# Patient Record
Sex: Male | Born: 1980 | Race: Black or African American | Hispanic: No | Marital: Single | Smoking: Never smoker
Health system: Southern US, Community
[De-identification: ages and names within clinical notes are randomized; demographics above are authoritative.]

---

## 2013-03-30 ENCOUNTER — Emergency Department (HOSPITAL_BASED_OUTPATIENT_CLINIC_OR_DEPARTMENT_OTHER): Payer: No Typology Code available for payment source

## 2013-03-30 ENCOUNTER — Emergency Department (HOSPITAL_BASED_OUTPATIENT_CLINIC_OR_DEPARTMENT_OTHER)
Admission: EM | Admit: 2013-03-30 | Discharge: 2013-03-31 | Disposition: A | Discharge status: Home / Self Care | Payer: No Typology Code available for payment source | Attending: Emergency Medicine | Admitting: Emergency Medicine

## 2013-03-30 ENCOUNTER — Encounter (HOSPITAL_BASED_OUTPATIENT_CLINIC_OR_DEPARTMENT_OTHER): Payer: Self-pay | Admitting: Emergency Medicine

## 2013-03-30 DIAGNOSIS — S60219A Contusion of unspecified wrist, initial encounter: Secondary | ICD-10-CM | POA: Insufficient documentation

## 2013-03-30 DIAGNOSIS — S161XXA Strain of muscle, fascia and tendon at neck level, initial encounter: Secondary | ICD-10-CM

## 2013-03-30 DIAGNOSIS — S239XXA Sprain of unspecified parts of thorax, initial encounter: Secondary | ICD-10-CM | POA: Insufficient documentation

## 2013-03-30 DIAGNOSIS — Y92410 Unspecified street and highway as the place of occurrence of the external cause: Secondary | ICD-10-CM | POA: Insufficient documentation

## 2013-03-30 DIAGNOSIS — S29012A Strain of muscle and tendon of back wall of thorax, initial encounter: Secondary | ICD-10-CM

## 2013-03-30 DIAGNOSIS — Y9389 Activity, other specified: Secondary | ICD-10-CM | POA: Insufficient documentation

## 2013-03-30 DIAGNOSIS — S233XXA Sprain of ligaments of thoracic spine, initial encounter: Secondary | ICD-10-CM

## 2013-03-30 DIAGNOSIS — S139XXA Sprain of joints and ligaments of unspecified parts of neck, initial encounter: Secondary | ICD-10-CM | POA: Insufficient documentation

## 2013-03-30 DIAGNOSIS — S0990XA Unspecified injury of head, initial encounter: Secondary | ICD-10-CM | POA: Insufficient documentation

## 2013-03-30 DIAGNOSIS — S60212A Contusion of left wrist, initial encounter: Secondary | ICD-10-CM
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
## 2013-03-30 NOTE — ED Notes (Signed)
Pt transported to xray 
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
## 2013-03-31 MED ORDER — CYCLOBENZAPRINE HCL 10 MG PO TABS: 10.0000 mg | ORAL_TABLET | Freq: Three times a day (TID) | ORAL | Status: DC | PRN

## 2013-03-31 MED ORDER — HYDROCODONE-ACETAMINOPHEN 5-325 MG PO TABS: 1.0000 | ORAL_TABLET | Freq: Four times a day (QID) | ORAL | Status: DC | PRN

## 2013-03-31 NOTE — ED Notes (Signed)
Returned from xray
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
## 2015-04-01 ENCOUNTER — Emergency Department (HOSPITAL_BASED_OUTPATIENT_CLINIC_OR_DEPARTMENT_OTHER)
Admission: EM | Admit: 2015-04-01 | Discharge: 2015-04-01 | Disposition: A | Discharge status: Home / Self Care | Payer: No Typology Code available for payment source | Attending: Emergency Medicine | Admitting: Emergency Medicine

## 2015-04-01 ENCOUNTER — Encounter (HOSPITAL_BASED_OUTPATIENT_CLINIC_OR_DEPARTMENT_OTHER): Payer: Self-pay | Admitting: *Deleted

## 2015-04-01 DIAGNOSIS — Y9289 Other specified places as the place of occurrence of the external cause: Secondary | ICD-10-CM | POA: Diagnosis not present

## 2015-04-01 DIAGNOSIS — Y9389 Activity, other specified: Secondary | ICD-10-CM | POA: Diagnosis not present

## 2015-04-01 DIAGNOSIS — W57XXXA Bitten or stung by nonvenomous insect and other nonvenomous arthropods, initial encounter: Secondary | ICD-10-CM | POA: Diagnosis not present

## 2015-04-01 DIAGNOSIS — S40862A Insect bite (nonvenomous) of left upper arm, initial encounter: Secondary | ICD-10-CM | POA: Insufficient documentation

## 2015-04-01 DIAGNOSIS — S60562A Insect bite (nonvenomous) of left hand, initial encounter: Secondary | ICD-10-CM | POA: Insufficient documentation

## 2015-04-01 DIAGNOSIS — Y998 Other external cause status: Secondary | ICD-10-CM | POA: Insufficient documentation

## 2015-04-01 MED ORDER — PREDNISONE 20 MG PO TABS: 40.0000 mg | ORAL_TABLET | Freq: Every day | ORAL | Status: DC

## 2015-04-01 NOTE — ED Provider Notes (Signed)
CSN: [id number]     Arrival date & time 04/01/15  1609 History   First MD Initiated Contact with Patient 04/01/15 1629     Chief Complaint  Patient presents with  . Insect Bite     (Consider location/radiation/quality/duration/timing/severity/associated sxs/prior Treatment) HPI   Charles Mcguire is a(n) 34 y.o. male who presents with c/o insect bite.  Onset yesterday. Location Left hand and left upper arm. Associated swelling, redness and itching. Denies pain. He has not taken any medications for treatment. Denies fever or chills, pain with movement of hand or arm.  History reviewed. No pertinent past medical history. History reviewed. No pertinent past surgical history. No family history on file. Social History  Substance Use Topics  . Smoking status: Never Smoker   . Smokeless tobacco: None  . Alcohol Use: Yes    Review of Systems  Constitutional: Negative for fever and chills.  Musculoskeletal: Negative for myalgias and joint swelling.  Skin: Positive for rash. Negative for wound.      Allergies  Review of patient's allergies indicates no known allergies.  Home Medications   Prior to Admission medications   Medication Sig Start Date End Date Taking? Authorizing Provider  cyclobenzaprine (FLEXERIL) 10 MG tablet Take 1 tablet (10 mg total) by mouth 3 (three) times daily as needed for muscle spasms. 03/31/13   John Molpus, MD  HYDROcodone-acetaminophen (NORCO/VICODIN) 5-325 MG per tablet Take 1-2 tablets by mouth every 6 (six) hours as needed for pain. 03/31/13   John Molpus, MD  predniSONE (DELTASONE) 20 MG tablet Take 2 tablets (40 mg total) by mouth daily. 04/01/15   Amirr Achord, PA-C   BP 125/87 mmHg  Pulse 59  Temp(Src) 98.1 F (36.7 C) (Oral)  Resp 18  Ht  (1.854 m)  Wt 272 lb (123.378 kg)  BMI 35.89 kg/m2  SpO2 100% Physical Exam  Constitutional: He appears well-developed and well-nourished. No distress.  HENT:  Head: Normocephalic and atraumatic.    Eyes: Conjunctivae are normal. No scleral icterus.  Neck: Normal range of motion. Neck supple.  Cardiovascular: Normal rate, regular rhythm and normal heart sounds.   Pulmonary/Chest: Effort normal and breath sounds normal. No respiratory distress.  Abdominal: Soft. There is no tenderness.  Musculoskeletal: He exhibits no edema.       Arms:      Hands: Neurological: He is alert.  Skin: Skin is warm and dry. He is not diaphoretic.  Psychiatric: His behavior is normal.  Nursing note and vitals reviewed.   ED Course  Procedures (including critical care time) Labs Review Labs Reviewed - No data to display  Imaging Review No results found.   EKG Interpretation None      MDM   Final diagnoses:  Insect bite    Patient with insect bites. The left arm. He appears to have a hypersensitivity reaction to assumed mosquito bites. No signs of infection. Patient will be discharged with prednisone. He appears safe for discharge at this time    Arthor Captain, PA-C 04/01/15 1705  Mirian Mo, MD 04/01/15 2257

## 2015-04-01 NOTE — ED Notes (Signed)
Pt c/o left hand and left upper arm swelling/redness since last night. 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

## 2015-04-01 NOTE — Discharge Instructions (Signed)
Lyme Disease You may have been bitten by a tick and are to watch for the development of Lyme Disease. Lyme Disease is an infection that is caused by a bacteria The bacteria causing this disease is named Borreilia burgdorferi. If a tick is infected with this bacteria and then bites you, then Lyme Disease may occur. These ticks are carried by deer and rodents such as rabbits and mice and infest grassy as well as forested areas. Fortunately most tick bites do not cause Lyme Disease.  Lyme Disease is easier to prevent than to treat. First, covering your legs with clothing when walking in areas where ticks are possibly abundant will prevent their attachment because ticks tend to stay within inches of the ground. Second, using insecticides containing DEET can be applied on skin or clothing. Last, because it takes about 12 to 24 hours for the tick to transmit the disease after attachment to the human host, you should inspect your body for ticks twice a day when you are in areas where Lyme Disease is common. You must look thoroughly when searching for ticks. The Ixodes tick that carries Lyme Disease is very small. It is around the size of a sesame seed (picture of tick is not actual size). Removal is best done by grasping the tick by the head and pulling it out. Do not to squeeze the body of the tick. This could inject the infecting bacteria into the bite site. Wash the area of the bite with an antiseptic solution after removal.  Lyme Disease is a disease that may affect many body systems. Because of the small size of the biting tick, most people do not notice being bitten. The first sign of an infection is usually a round red rash that extends out from the center of the tick bite. The center of the lesion may be blood colored (hemorrhagic) or have tiny blisters (vesicular). Most lesions have bright red outer borders and partial central clearing. This rash may extend out many inches in diameter, and multiple lesions  may be present. Other symptoms such as fatigue, headaches, chills and fever, general achiness and swelling of lymph glands may also occur. If this first stage of the disease is left untreated, these symptoms may gradually resolve by themselves, or progressive symptoms may occur because of spread of infection to other areas of the body.  Follow up with your caregiver to have testing and treatment if you have a tick bite and you develop any of the above complaints. Your caregiver may recommend preventative (prophylactic) medications which kill bacteria (antibiotics). Once a diagnosis of Lyme Disease is made, antibiotic treatment is highly likely to cure the disease. Effective treatment of late stage Lyme Disease may require longer courses of antibiotic therapy.  MAKE SURE YOU:   Understand these instructions.  Will watch your condition.  Will get help right away if you are not doing well or get worse. Document Released: 11/14/2000 Document Revised: 10/31/2011 Document Reviewed: 01/16/2009 [facility] Patient Information 2015 [place], [place]. This information is not intended to replace advice given to you by your health care provider. Make sure you discuss any questions you have with your health care provider.  DEET Insect Repellent  DEET is a commonly used insect repellent. DEET is effective against mosquitoes, ticks, and chiggers.DEET is not effective against stinging insects, such as bees and wasps. When mosquitoes or ticks are active, take the following precautions.  Use DEET according to the directions on the label.  Wear protective clothing  if you are outside in an area where there are weeds, tall grass, or bushes. This includes long pants, socks, and loose-fitting, long-sleeved shirts. Consider spraying DEET on your clothing. Avoid being outdoors in the early evening. This is when mosquitoes are most active.  Products with a low concentration of DEET (10% to 20%) may be useful in areas with few  insects. Higher concentrations of DEET may be needed in areas with many insects. Repellents used on children should not contain more than 30% DEET. Although higher concentrations of DEET (up to 95%) are available for adults, they are not recommended for routine use. Concentrations higher than 50% do not provide additional protection. Depending on the concentration of DEET in a product, it can be effective for about 2 to 6 hours.  When applying DEET to children, use the lowest concentration that is effective. Ten percent DEET will last approximately 2 to 3 hours, while 30% will last 4 to 5 hours. Do not use DEET on infants younger than 2 months old. Do not apply DEET more often than once a day to children under the age of 2.  Avoid prolonged or excessive use of DEET. Use it sparingly to cover exposed skin and clothing. Adverse reactions to DEET in the recommended concentrations are uncommon. However, skin irritation can occur in some people.  Wash all treated skin and clothing with soap and water after returning indoors.  Do not allow children to apply insect repellent themselves.  Do not apply DEET near cuts or open wounds. You can apply DEET and sunscreen together. However, it is recommend that you apply the sunscreen first.  Do not apply DEET to a child's hands or near a child's eyes and mouth. If DEET is accidentally sprayed in the eyes, wash the eyes out with large amounts of water.  Store DEET out of the reach of children.  Most authorities feel that it is safe to use DEET during pregnancy. However, pregnant women should only use insect repellents when they are in areas with a high risk of disease carried by insects (malaria, West Nile virus, encephalitis). Document Released: 05/03/2001 Document Revised: 12/23/2013 Document Reviewed: 04/27/2011 [facility] Patient Information 2015 [place], [place]. This information is not intended to replace advice given to you by your health care provider. Make  sure you discuss any questions you have with your health care provider.

## 2016-10-01 ENCOUNTER — Emergency Department (HOSPITAL_BASED_OUTPATIENT_CLINIC_OR_DEPARTMENT_OTHER)
Admission: EM | Admit: 2016-10-01 | Discharge: 2016-10-01 | Disposition: A | Discharge status: Home / Self Care | Payer: Self-pay | Attending: Emergency Medicine | Admitting: Emergency Medicine

## 2016-10-01 ENCOUNTER — Emergency Department (HOSPITAL_BASED_OUTPATIENT_CLINIC_OR_DEPARTMENT_OTHER): Payer: Self-pay

## 2016-10-01 ENCOUNTER — Encounter (HOSPITAL_BASED_OUTPATIENT_CLINIC_OR_DEPARTMENT_OTHER): Payer: Self-pay | Admitting: *Deleted

## 2016-10-01 DIAGNOSIS — J069 Acute upper respiratory infection, unspecified: Secondary | ICD-10-CM | POA: Insufficient documentation

## 2016-10-01 MED ORDER — IBUPROFEN 400 MG PO TABS
600.0000 mg | ORAL_TABLET | Freq: Once | ORAL | Status: AC
  Administered 2016-10-01: 600 mg via ORAL
  Filled 2016-10-01: qty 1

## 2016-10-01 MED ORDER — BENZONATATE 100 MG PO CAPS: 100.0000 mg | ORAL_CAPSULE | Freq: Three times a day (TID) | ORAL | 0 refills | Status: AC | PRN

## 2016-10-01 NOTE — ED Notes (Signed)
Pt made aware to return if symptoms worsen or if any life threatening symptoms occur.   

## 2016-10-01 NOTE — ED Triage Notes (Signed)
Patient states he has a five day history of generalized body aches, back ache, sinus congestion and drainage, sneezing and sore throat.  Temperatures as high as 100.5.   Using otc meds with no relief.

## 2016-10-01 NOTE — ED Provider Notes (Signed)
[facility] Provider Note   CSN: [id number] Arrival date & time: 10/01/16  1022     History   Chief Complaint Chief Complaint  Patient presents with  . Influenza    HPI Charles Mcguire is a 36 y.o. male.  HPI   Wed sore throat  Cough Thurs, cough nonproductive Sneezing Fever Fri Back pain with coughing No other myalgias other than back pain with cough Trying several over the counter medications without relief No numbness/weakness/no loss of control of bowel or bladder No hx of IVDU nor trauma   No past medical history on file.  There are no active problems to display for this patient.   History reviewed. No pertinent surgical history.     Home Medications    Prior to Admission medications   Medication Sig Start Date End Date Taking? Authorizing Provider  benzonatate (TESSALON) 100 MG capsule Take 1 capsule (100 mg total) by mouth 3 (three) times daily as needed for cough. 10/01/16   Alvira Monday, MD    Family History No family history on file.  Social History Social History  Substance Use Topics  . Smoking status: Never Smoker  . Smokeless tobacco: Never Used  . Alcohol use Yes     Allergies   Patient has no known allergies.   Review of Systems Review of Systems  Constitutional: Positive for appetite change, chills, fatigue and fever.  HENT: Positive for congestion and rhinorrhea. Negative for sore throat.   Eyes: Negative for visual disturbance.  Respiratory: Positive for cough. Negative for shortness of breath.   Cardiovascular: Negative for chest pain.  Gastrointestinal: Negative for abdominal pain, diarrhea, nausea and vomiting.  Genitourinary: Negative for difficulty urinating.  Musculoskeletal: Positive for back pain. Negative for neck stiffness.  Skin: Negative for rash.  Neurological: Negative for syncope and headaches.     Physical Exam Updated Vital Signs BP 128/80   Pulse 72   Temp 102.6 F (39.2 C)   Resp 20    Ht 6\' 1"  (1.854 m)   Wt 280 lb (127 kg)   SpO2 98%   BMI 36.94 kg/m   Physical Exam  Constitutional: He is oriented to person, place, and time. He appears well-developed and well-nourished. No distress.  HENT:  Head: Normocephalic and atraumatic.  Eyes: Conjunctivae and EOM are normal.  Neck: Normal range of motion.  Cardiovascular: Normal rate, regular rhythm, normal heart sounds and intact distal pulses.  Exam reveals no gallop and no friction rub.   No murmur heard. Pulmonary/Chest: Effort normal and breath sounds normal. No respiratory distress. He has no wheezes. He has no rales.  Abdominal: Soft. He exhibits no distension. There is no tenderness. There is no guarding.  Musculoskeletal: He exhibits no edema.       Lumbar back: He exhibits tenderness.  Neurological: He is alert and oriented to person, place, and time.  Skin: Skin is warm and dry. He is not diaphoretic.  Nursing note and vitals reviewed.    ED Treatments / Results  Labs (all labs ordered are listed, but only abnormal results are displayed) Labs Reviewed - No data to display  EKG  EKG Interpretation None       Radiology Dg Chest 2 View  Result Date: 10/01/2016 CLINICAL DATA:  Cough and body aches x 5 days. Temperatures as high as 100.5. EXAM: CHEST - 2 VIEW COMPARISON:  03/30/2013 FINDINGS: Lungs are clear. Heart size and mediastinal contours are within normal limits. No effusion. Visualized bones unremarkable.  IMPRESSION: No acute cardiopulmonary disease. Electronically Signed   By: Corlis Leak  Hassell M.D.   On: 10/01/2016 11:25    Procedures Procedures (including critical care time)  Medications Ordered in ED Medications  ibuprofen (ADVIL,MOTRIN) tablet 600 mg (600 mg Oral Given 10/01/16 1218)     Initial Impression / Assessment and Plan / ED Course  I have reviewed the triage vital signs and the nursing notes.  Pertinent labs & imaging results that were available during my care of the patient were  reviewed by me and considered in my medical decision making (see chart for details).     36yo male with no significant medical history presents with concern for cough, back pain, congestion, sore throat, fever.  CXR without signs of pneumonia.  No risk factors for epidural abscess and low suspicion for this as etiology of back pain in setting of several other symptoms.  Pt most likely with influenza.  Symptoms began greater than 48hr ago and given no other risk factors do not feel tamiflu is indicated at this time.  Recommend continued supportive care, fluids, ibuprofen, tylenol, tessalon. Patient discharged in stable condition with understanding of reasons to return.   Final Clinical Impressions(s) / ED Diagnoses   Final diagnoses:  Upper respiratory tract infection, unspecified type    New Prescriptions Discharge Medication List as of 10/01/2016 11:56 AM    START taking these medications   Details  benzonatate (TESSALON) 100 MG capsule Take 1 capsule (100 mg total) by mouth 3 (three) times daily as needed for cough., Starting Sat 10/01/2016, Print         Alvira MondayErin Kaveon Blatz, MD 10/01/16 2019

## 2018-03-22 IMAGING — CR DG CHEST 2V
2 series · 2 of 2 positions shown · non-contrast
Comparison: 03/30/2013

CLINICAL DATA: Cough and body aches x 5 days. Temperatures as high
as 100.5.

EXAM:
CHEST - 2 VIEW

[w chest pa]
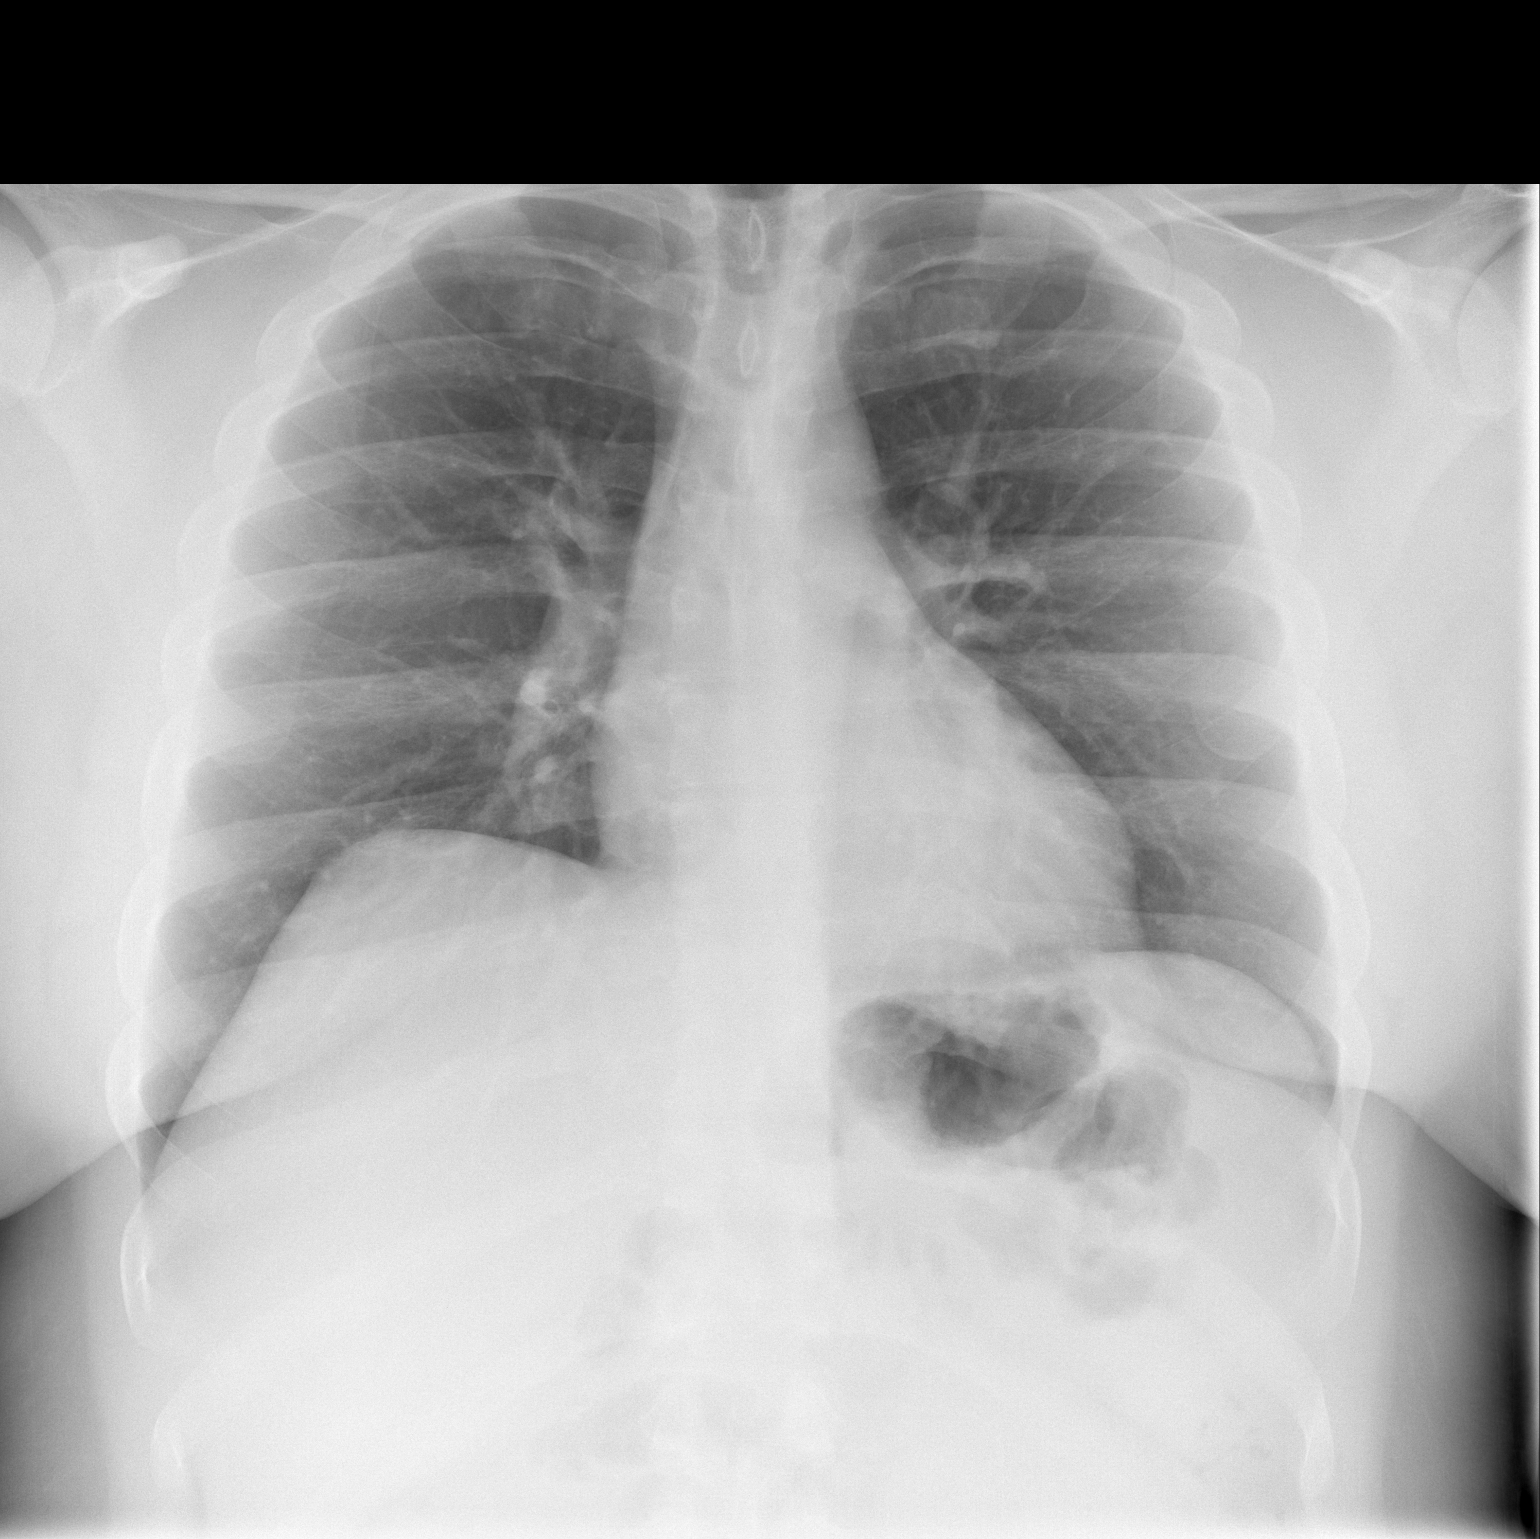

[w chest lat]
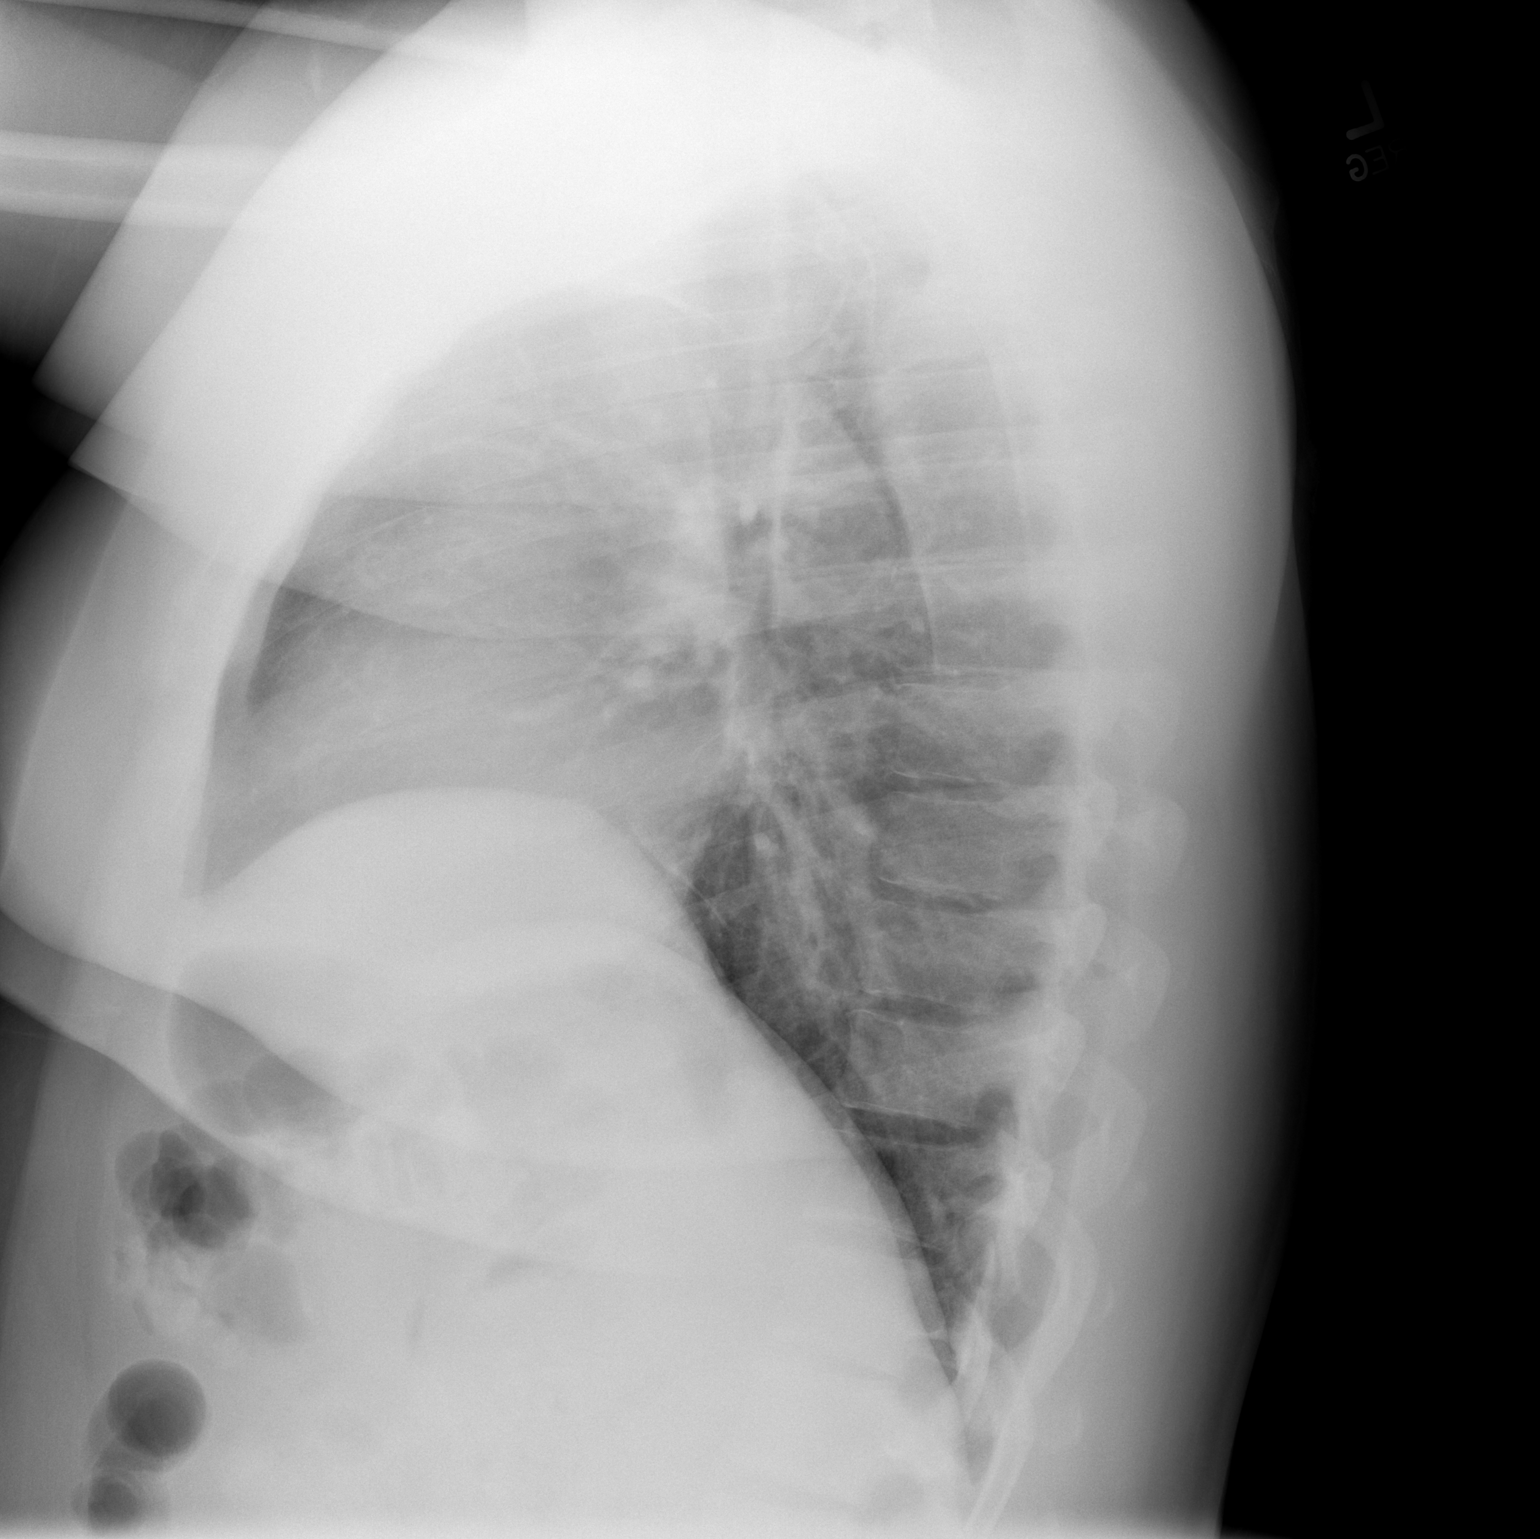

[2 of 2 positions shown; findings below may reference images not displayed]

FINDINGS: Lungs are clear.

Heart size and mediastinal contours are within normal limits.

No effusion.

Visualized bones unremarkable.
IMPRESSION: No acute cardiopulmonary disease.
